# Patient Record
Sex: Female | Born: 1990 | Race: White | Hispanic: No | Marital: Married | Smoking: Never smoker
Health system: Southern US, Community
[De-identification: ages and names within clinical notes are randomized; demographics above are authoritative.]

## PROBLEM LIST (undated history)

## (undated) DIAGNOSIS — F329 Major depressive disorder, single episode, unspecified: Secondary | ICD-10-CM

---

## 2018-02-13 ENCOUNTER — Emergency Department: Payer: BLUE CROSS/BLUE SHIELD

## 2018-02-13 ENCOUNTER — Emergency Department
Admission: EM | Admit: 2018-02-13 | Discharge: 2018-02-13 | Disposition: A | Discharge status: Home / Self Care | Payer: BLUE CROSS/BLUE SHIELD | Attending: Student in an Organized Health Care Education/Training Program | Admitting: Student in an Organized Health Care Education/Training Program

## 2018-02-13 ENCOUNTER — Other Ambulatory Visit: Payer: Self-pay

## 2018-02-13 DIAGNOSIS — J181 Lobar pneumonia, unspecified organism: Secondary | ICD-10-CM | POA: Insufficient documentation

## 2018-02-13 DIAGNOSIS — J189 Pneumonia, unspecified organism: Secondary | ICD-10-CM

## 2018-02-13 DIAGNOSIS — R0602 Shortness of breath: Secondary | ICD-10-CM | POA: Diagnosis present

## 2018-02-13 HISTORY — DX: Major depressive disorder, single episode, unspecified: F32.9

## 2018-02-13 MED ORDER — LEVOFLOXACIN 750 MG PO TABS: 750.0000 mg | ORAL_TABLET | Freq: Every day | ORAL | 0 refills | Status: AC

## 2018-02-13 MED ORDER — ALBUTEROL SULFATE HFA 108 (90 BASE) MCG/ACT IN AERS: 2.0000 | INHALATION_SPRAY | RESPIRATORY_TRACT | 0 refills | Status: AC | PRN

## 2018-02-13 MED ORDER — PSEUDOEPH-BROMPHEN-DM 30-2-10 MG/5ML PO SYRP: 10.0000 mL | ORAL_SOLUTION | Freq: Four times a day (QID) | ORAL | 0 refills | Status: AC | PRN

## 2018-02-13 NOTE — ED Triage Notes (Deleted)
Pt comes via POV from home with c/o cough. Pt states she went to [facility] and was checked for Flu and later given steroids.  Pt states that she felt better but then told to start antibiotic. Pt states she started them and has had cough and congestion.  Pt states fever of 101 and took motrin. Pt states she just hasn't gotten better with all the different medications.

## 2018-02-13 NOTE — ED Triage Notes (Signed)
Pt in via POV, reports cough, sore throat, headache x two days, states she feels as if it has moved into her chest, reports shortness of breath with cough.  Vitals WDL, ambulatory to triage, NAD noted at this time.

## 2018-02-13 NOTE — ED Provider Notes (Signed)
[facility] Emergency Department Provider Note  ____________________________________________  Time seen: Approximately 8:43 PM  I have reviewed the triage vital signs and the nursing notes.   HISTORY  Chief Complaint URI    HPI Janet Hawkins is a 28 y.o. female who presents the emergency department complaining of chest tightness, cough, mild sore throat, headache.  Patient reports that majority of complaint is cough.  Patient feels short of breath with coughing but not at rest.  Patient denies any fevers or chills, significant nasal congestion, ear pain, domino pain, nausea vomiting.  Patient is taken Tylenol, cough and cold medication, Robitussin with no relief.    Past Medical History:  Diagnosis Date  . Major depressive disorder     There are no active problems to display for this patient.   History reviewed. No pertinent surgical history.  Prior to Admission medications   Medication Sig Start Date End Date Taking? Authorizing Provider  albuterol (PROVENTIL HFA;VENTOLIN HFA) 108 (90 Base) MCG/ACT inhaler Inhale 2 puffs into the lungs every 4 (four) hours as needed for wheezing or shortness of breath. 02/13/18   Cuthriell, Delorise Royals, PA-C  brompheniramine-pseudoephedrine-DM 30-2-10 MG/5ML syrup Take 10 mLs by mouth 4 (four) times daily as needed. 02/13/18   Cuthriell, Delorise Royals, PA-C  levofloxacin (LEVAQUIN) 750 MG tablet Take 1 tablet (750 mg total) by mouth daily for 7 days. 02/13/18 02/20/18  Cuthriell, Delorise Royals, PA-C    Allergies Amoxicillin  No family history on file.  Social History Social History   Tobacco Use  . Smoking status: Never Smoker  . Smokeless tobacco: Never Used  Substance Use Topics  . Alcohol use: Yes  . Drug use: Never     Review of Systems  Constitutional: No fever/chills Eyes: No visual changes. No discharge ENT: Mild sore throat Cardiovascular: no chest pain. Respiratory: Positive cough. No  SOB. Gastrointestinal: No abdominal pain.  No nausea, no vomiting.  No diarrhea.  No constipation. Musculoskeletal: Negative for musculoskeletal pain. Skin: Negative for rash, abrasions, lacerations, ecchymosis. Neurological: Negative for headaches, focal weakness or numbness. 10-point ROS otherwise negative.  ____________________________________________   PHYSICAL EXAM:  VITAL SIGNS: ED Triage Vitals  Enc Vitals Group     BP 02/13/18 1826 129/87     Pulse Rate 02/13/18 1826 100     Resp 02/13/18 1826 20     Temp 02/13/18 1826 98.6 F (37 C)     Temp Source 02/13/18 1826 Oral     SpO2 02/13/18 1826 100 %     Weight 02/13/18 1827 168 lb (76.2 kg)     Height 02/13/18 1827 5\' 8"  (1.727 m)     Head Circumference --      Peak Flow --      Pain Score 02/13/18 1835 6     Pain Loc --      Pain Edu? --      Excl. in GC? --      Constitutional: Alert and oriented. Well appearing and in no acute distress. Eyes: Conjunctivae are normal. PERRL. EOMI. Head: Atraumatic. ENT:      Ears: EACs and TMs unremarkable bilaterally.      Nose: No congestion/rhinnorhea.      Mouth/Throat: Mucous membranes are moist.  Neck: No stridor.  Neck is supple full range of motion Hematological/Lymphatic/Immunilogical: No cervical lymphadenopathy. Cardiovascular: Normal rate, regular rhythm. Normal S1 and S2.  Good peripheral circulation. Respiratory: Normal respiratory effort without tachypnea or retractions. Lungs with a few crackles in  the left lower lung field.  Otherwise, no adventitious lung sounds on auscultation.Peri Jefferson air entry to the bases with no decreased or absent breath sounds. Musculoskeletal: Full range of motion to all extremities. No gross deformities appreciated. Neurologic:  Normal speech and language. No gross focal neurologic deficits are appreciated.  Skin:  Skin is warm, dry and intact. No rash noted. Psychiatric: Mood and affect are normal. Speech and behavior are normal. Patient  exhibits appropriate insight and judgement.   ____________________________________________   LABS (all labs ordered are listed, but only abnormal results are displayed)  Labs Reviewed - No data to display ____________________________________________  EKG   ____________________________________________  RADIOLOGY I personally viewed and evaluated these images as part of my medical decision making, as well as reviewing the written report by the radiologist.  Dg Chest 2 View  Result Date: 02/13/2018 CLINICAL DATA:  28 year old female with cough, sore throat and headache for the past 2 days EXAM: CHEST - 2 VIEW COMPARISON:  None FINDINGS: Focal patchy airspace opacity in the anterior aspect of the left lower lobe concerning for bronchopneumonia. No pleural effusion or pneumothorax. Cardiac and mediastinal contours are within normal limits. No acute fracture or lytic or blastic osseous lesions. The visualized upper abdominal bowel gas pattern is unremarkable. IMPRESSION: Anterior left lower lobe bronchopneumonia. Electronically Signed   By: Malachy Moan M.D.   On: 02/13/2018 19:08    ____________________________________________    PROCEDURES  Procedure(s) performed:    Procedures    Medications - No data to display   ____________________________________________   INITIAL IMPRESSION / ASSESSMENT AND PLAN / ED COURSE  Pertinent labs & imaging results that were available during my care of the patient were reviewed by me and considered in my medical decision making (see chart for details).  Review of the [place] CSRS was performed in accordance of the [facility] prior to dispensing any controlled drugs.      Patient's diagnosis is consistent with left lower lobe pneumonia.  Patient presents emergency department complaining of coughing, mild sore throat, chest tightness with cough.  Patient had evidently been treated for bronchitis, pneumonia with azithromycin, prednisone.  These  did not improve patient's symptoms.  On chest x-ray patient does have left lower lobe pneumonia.  As such, I will place patient on Levaquin.  Patient will have albuterol and Bromfed cough syrup for additional symptom control.  Tylenol and Motrin with additional over-the-counter medications as needed.  Follow-up with primary care..Patient is given ED precautions to return to the ED for any worsening or new symptoms.     ____________________________________________  FINAL CLINICAL IMPRESSION(S) / ED DIAGNOSES  Final diagnoses:  Pneumonia of left lower lobe due to infectious organism [facility])      NEW MEDICATIONS STARTED DURING THIS VISIT:  ED Discharge Orders         Ordered    levofloxacin (LEVAQUIN) 750 MG tablet  Daily     02/13/18 2047    albuterol (PROVENTIL HFA;VENTOLIN HFA) 108 (90 Base) MCG/ACT inhaler  Every 4 hours PRN     02/13/18 2047    brompheniramine-pseudoephedrine-DM 30-2-10 MG/5ML syrup  4 times daily PRN     02/13/18 2047              This chart was dictated using voice recognition software/Dragon. Despite best efforts to proofread, errors can occur which can change the meaning. Any change was purely unintentional.    Lanette Hampshire 02/13/18 2049    Willy Eddy, MD 02/13/18  2322  

## 2018-02-13 NOTE — ED Triage Notes (Addendum)
FIRST NURSE NOTE-here for cough and pain associated with cough.  Pulled for triage.

## 2018-02-21 ENCOUNTER — Other Ambulatory Visit: Payer: Self-pay

## 2018-02-21 ENCOUNTER — Emergency Department (HOSPITAL_COMMUNITY)
Admission: EM | Admit: 2018-02-21 | Discharge: 2018-02-22 | Discharge status: Against Medical Advice | Payer: BLUE CROSS/BLUE SHIELD | Attending: Emergency Medicine | Admitting: Emergency Medicine

## 2018-02-21 ENCOUNTER — Encounter (HOSPITAL_COMMUNITY): Payer: Self-pay | Admitting: *Deleted

## 2018-02-21 DIAGNOSIS — Z79899 Other long term (current) drug therapy: Secondary | ICD-10-CM | POA: Diagnosis not present

## 2018-02-21 DIAGNOSIS — R079 Chest pain, unspecified: Secondary | ICD-10-CM

## 2018-02-21 DIAGNOSIS — R200 Anesthesia of skin: Secondary | ICD-10-CM | POA: Diagnosis not present

## 2018-02-21 DIAGNOSIS — Z5320 Procedure and treatment not carried out because of patient's decision for unspecified reasons: Secondary | ICD-10-CM | POA: Insufficient documentation

## 2018-02-21 DIAGNOSIS — M5412 Radiculopathy, cervical region: Secondary | ICD-10-CM

## 2018-02-21 DIAGNOSIS — M542 Cervicalgia: Secondary | ICD-10-CM | POA: Insufficient documentation

## 2018-02-21 DIAGNOSIS — R05 Cough: Secondary | ICD-10-CM | POA: Diagnosis not present

## 2018-02-21 DIAGNOSIS — R0789 Other chest pain: Secondary | ICD-10-CM | POA: Diagnosis not present

## 2018-02-21 LAB — CBC
HCT: 36.9 % (ref 36.0–46.0)
Hemoglobin: 11.9 g/dL — ABNORMAL LOW (ref 12.0–15.0)
MCH: 30.7 pg (ref 26.0–34.0)
MCHC: 32.2 g/dL (ref 30.0–36.0)
MCV: 95.1 fL (ref 80.0–100.0)
Platelets: 295 10*3/uL (ref 150–400)
RBC: 3.88 MIL/uL (ref 3.87–5.11)
RDW: 11.4 % — ABNORMAL LOW (ref 11.5–15.5)
WBC: 8.5 10*3/uL (ref 4.0–10.5)
nRBC: 0 % (ref 0.0–0.2)

## 2018-02-21 LAB — BASIC METABOLIC PANEL
Anion gap: 10 (ref 5–15)
BUN: 9 mg/dL (ref 6–20)
CO2: 23 mmol/L (ref 22–32)
Calcium: 9 mg/dL (ref 8.9–10.3)
Chloride: 103 mmol/L (ref 98–111)
Creatinine, Ser: 0.91 mg/dL (ref 0.44–1.00)
GFR calc Af Amer: >60 mL/min (ref >60)
GFR calc non Af Amer: >60 mL/min (ref >60)
Glucose, Bld: 101 mg/dL — ABNORMAL HIGH (ref 70–99)
Potassium: 3.7 mmol/L (ref 3.5–5.1)
Sodium: 136 mmol/L (ref 135–145)

## 2018-02-21 LAB — TROPONIN I: Troponin I: <0.03 ng/mL (ref <0.03)

## 2018-02-21 MED ORDER — SODIUM CHLORIDE 0.9% FLUSH: 3.0000 mL | Freq: Once | INTRAVENOUS | Status: DC

## 2018-02-21 NOTE — ED Triage Notes (Signed)
Pt arrives with c/o chest pain. Reports she was recently seen and treated for pneumonia. She has completed medication but still having pain in the left chest with some left arm numbness. She went to urgent care prior (arrives with report -negative chest xray results and ekg). NAD

## 2018-02-22 ENCOUNTER — Emergency Department (HOSPITAL_COMMUNITY): Payer: BLUE CROSS/BLUE SHIELD

## 2018-02-22 NOTE — ED Notes (Signed)
Delay in repeat ekg,  Pt not in room.

## 2018-02-22 NOTE — ED Provider Notes (Signed)
[facility]   CSN: [id number] Arrival date & time: 02/21/18  1956     History   Chief Complaint Chief Complaint  Patient presents with  . Chest Pain    HPI Janet Hawkins is a 28 y.o. female with a hx of depression presents to the Emergency Department complaining of gradual, persistent, progressively worsening left-sided chest pain onset approximately 10 days ago.  Patient was evaluated in [facility] emergency department at [facility] on 02/13/2018 and diagnosed with pneumonia of the left lower lobe.  She was given antibiotics.  Patient reports she was additionally given albuterol inhaler however she is not using this because she does not feel it is helping her breathing.  She reports a persistent cough and persistent left-sided chest pain despite completing her antibiotic yesterday.  Patient reports she is concerned about this.  Additionally she reports left-sided neck pain and some paresthesias of the left arm.  Patient denies headache, fevers, chills, abdominal pain, nausea, vomiting, diarrhea, weakness, dizziness, syncope.  Patient does report she has persistent wheezing and some shortness of breath with significant coughing spells.  She reports this has been ongoing throughout the week.  The history is provided by the patient and medical records. No language interpreter was used.    Past Medical History:  Diagnosis Date  . Major depressive disorder     There are no active problems to display for this patient.   History reviewed. No pertinent surgical history.   OB History   No obstetric history on file.      Home Medications    Prior to Admission medications   Medication Sig Start Date End Date Taking? Authorizing Provider  albuterol (PROVENTIL HFA;VENTOLIN HFA) 108 (90 Base) MCG/ACT inhaler Inhale 2 puffs into the lungs every 4 (four) hours as needed for wheezing or shortness of breath. 02/13/18  Yes  Cuthriell, Delorise RoyalsJonathan D, PA-C  buPROPion (WELLBUTRIN XL) 300 MG 24 hr tablet Take 300 mg by mouth daily. 12/24/17  Yes [provider]  lamoTRIgine (LAMICTAL) 150 MG tablet Take 150 mg by mouth daily. 02/06/18  Yes [provider]  levocetirizine (XYZAL) 5 MG tablet Take 5 mg by mouth every evening.   Yes [provider]  Norethindrone-Ethinyl Estradiol-Fe Biphas (LO LOESTRIN FE) 1 MG-10 MCG / 10 MCG tablet Take 1 tablet by mouth daily.   Yes [provider]  venlafaxine XR (EFFEXOR-XR) 150 MG 24 hr capsule Take 150 mg by mouth daily. 02/10/18  Yes [provider]  VYVANSE 50 MG capsule Take 50 mg by mouth daily. 02/13/18  Yes [provider]  brompheniramine-pseudoephedrine-DM 30-2-10 MG/5ML syrup Take 10 mLs by mouth 4 (four) times daily as needed. Patient not taking: Reported on 02/22/2018 02/13/18   Cuthriell, Delorise RoyalsJonathan D, PA-C    Family History No family history on file.  Social History Social History   Tobacco Use  . Smoking status: Never Smoker  . Smokeless tobacco: Never Used  Substance Use Topics  . Alcohol use: Yes  . Drug use: Never     Allergies   Amoxicillin   Review of Systems Review of Systems  Constitutional: Negative for appetite change, diaphoresis, fatigue, fever and unexpected weight change.  HENT: Negative for mouth sores.   Eyes: Negative for visual disturbance.  Respiratory: Positive for cough and chest tightness. Negative for shortness of breath and wheezing.   Cardiovascular: Positive for chest pain.  Gastrointestinal: Negative for abdominal pain, constipation, diarrhea, nausea and vomiting.  Endocrine: Negative for polydipsia, polyphagia and polyuria.  Genitourinary: Negative for dysuria, frequency, hematuria and urgency.  Musculoskeletal: Positive for arthralgias ( left arm) and neck pain. Negative for back pain and neck stiffness.  Skin: Negative for rash.  Allergic/Immunologic: Negative for  immunocompromised state.  Neurological: Negative for syncope, light-headedness and headaches.  Hematological: Does not bruise/bleed easily.  Psychiatric/Behavioral: Negative for sleep disturbance. The patient is not nervous/anxious.      Physical Exam Updated Vital Signs BP 121/79   Pulse 91   Temp 98.3 F (36.8 C) (Oral)   Resp 18   LMP 01/21/2018   SpO2 97%   Physical Exam Vitals signs and nursing note reviewed.  Constitutional:      General: She is not in acute distress.    Appearance: She is well-developed. She is not diaphoretic.     Comments: Awake, alert, nontoxic appearance  HENT:     Head: Normocephalic and atraumatic.     Mouth/Throat:     Pharynx: No oropharyngeal exudate.  Eyes:     General: No scleral icterus.    Conjunctiva/sclera: Conjunctivae normal.  Neck:     Musculoskeletal: Normal range of motion and neck supple. Muscular tenderness ( left side) present. No spinous process tenderness.     Comments: Palpation of the left paraspinal muscles and left trapezius elicit paresthesias noted of the left hand. Cardiovascular:     Rate and Rhythm: Normal rate and regular rhythm.  Pulmonary:     Effort: Pulmonary effort is normal. No respiratory distress.     Breath sounds: Normal breath sounds. No wheezing.  Abdominal:     General: Bowel sounds are normal.     Palpations: Abdomen is soft. There is no mass.     Tenderness: There is no abdominal tenderness. There is no guarding or rebound.  Musculoskeletal: Normal range of motion.  Skin:    General: Skin is warm and dry.  Neurological:     Mental Status: She is alert.     Comments: Speech is clear and goal oriented Moves extremities without ataxia      ED Treatments / Results  Labs (all labs ordered are listed, but only abnormal results are displayed) Labs Reviewed  BASIC METABOLIC PANEL - Abnormal; Notable for the following components:      Result Value   Glucose, Bld 101 (*)    All other components  within normal limits  CBC - Abnormal; Notable for the following components:   Hemoglobin 11.9 (*)    RDW 11.4 (*)    All other components within normal limits  TROPONIN I  I-STAT BETA HCG BLOOD, ED ([facility], [facility], [facility] ONLY)    ED ECG REPORT   Date: 02/22/2018  Rate: 92  Rhythm: normal sinus rhythm  QRS Axis: normal  Intervals: normal  ST/T Wave abnormalities: normal  Conduction Disutrbances:none  Narrative Interpretation: NSR, unchanged from 02/14/2018  Old EKG Reviewed: unchanged  I have personally reviewed the EKG tracing and agree with the computerized printout as noted.   Radiology Dg Chest 2 View  Result Date: 02/22/2018 CLINICAL DATA:  Chest pain.  History of pneumonia. EXAM: CHEST - 2 VIEW COMPARISON:  Chest radiograph February 13, 2018 FINDINGS: Cardiomediastinal silhouette is normal. No pleural effusions or focal consolidations. Resolution of prior consolidation. Trachea projects midline and there is no pneumothorax. Soft tissue planes and included osseous structures are non-suspicious. IMPRESSION: Negative. Electronically Signed   By: Awilda Metro M.D.   On: 02/22/2018 02:20    Procedures  Procedures (including critical care time)  Medications Ordered in ED Medications  sodium chloride flush (NS) 0.9 % injection 3 mL (has no administration in time range)     Initial Impression / Assessment and Plan / ED Course  I have reviewed the triage vital signs and the nursing notes.  Pertinent labs & imaging results that were available during my care of the patient were reviewed by me and considered in my medical decision making (see chart for details).     With persistent chest pain after diagnosis of pneumonia.  X-ray today is without evidence of persistent pneumonia.  Lung sounds are clear and equal without wheezing.  Patient is afebrile without tachycardia.  She has no leg swelling or calf tenderness.  Highly doubt pulmonary embolism.  Noted is negative.  Suspect that her  chest pain is simply sequela from her pneumonia.  Discussed conservative therapies.  Unable to locate patient's initial EKG at this time.  Will repeat.  If negative patient may be discharged home.  Also with some complaints of neck pain and left arm paresthesias.  She has palpable muscle spasm to the left paraspinal muscles and palpation of this elicits paresthesia of the left arm.  Suspect radiculopathy.  Discussed conservative therapies for this as well.  Will prescribe muscle relaxer.  3:39 AM Pt eloped prior to localization of initial ECG or repeat ECG.     Final Clinical Impressions(s) / ED Diagnoses   Final diagnoses:  Left-sided chest pain  Cervical radiculopathy    ED Discharge Orders    None       Gannon Heinzman, Boyd Kerbs 02/22/18 0543    Dione Booze, MD 02/22/18 [phone]

## 2018-02-22 NOTE — ED Notes (Signed)
Patient transported to X-ray 

## 2018-02-22 NOTE — ED Notes (Signed)
  Patient not in room.  Patient did not answer when called in waiting room.  Dahlia ClientHannah Muthersbaugh PA notified.

## 2019-08-11 IMAGING — CR DG CHEST 2V
2 series · 2 of 2 positions shown · non-contrast
Comparison: None

CLINICAL DATA: 27-year-old female with cough, sore throat and
headache for the past 2 days

EXAM:
CHEST - 2 VIEW

[chest pa]
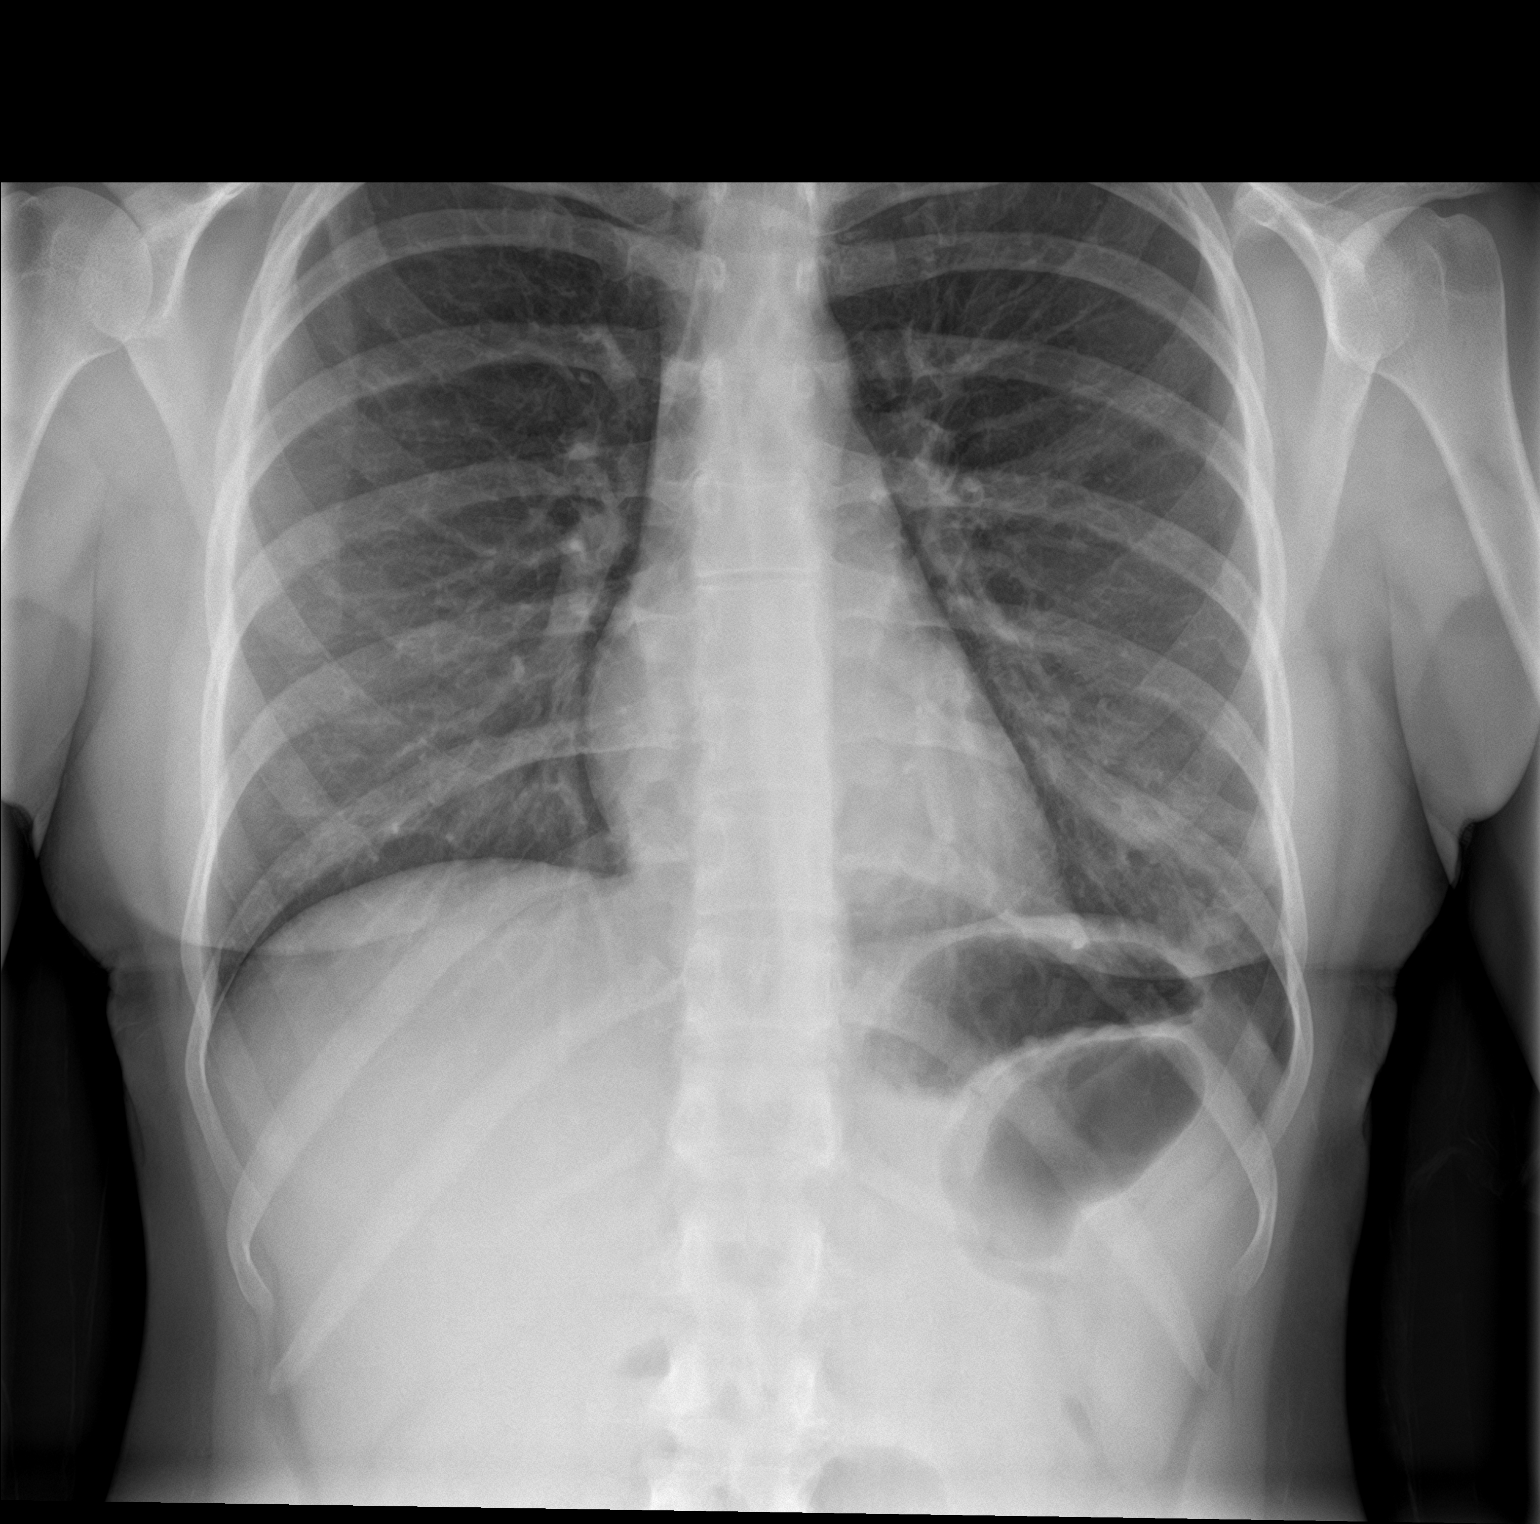

[chest lat]
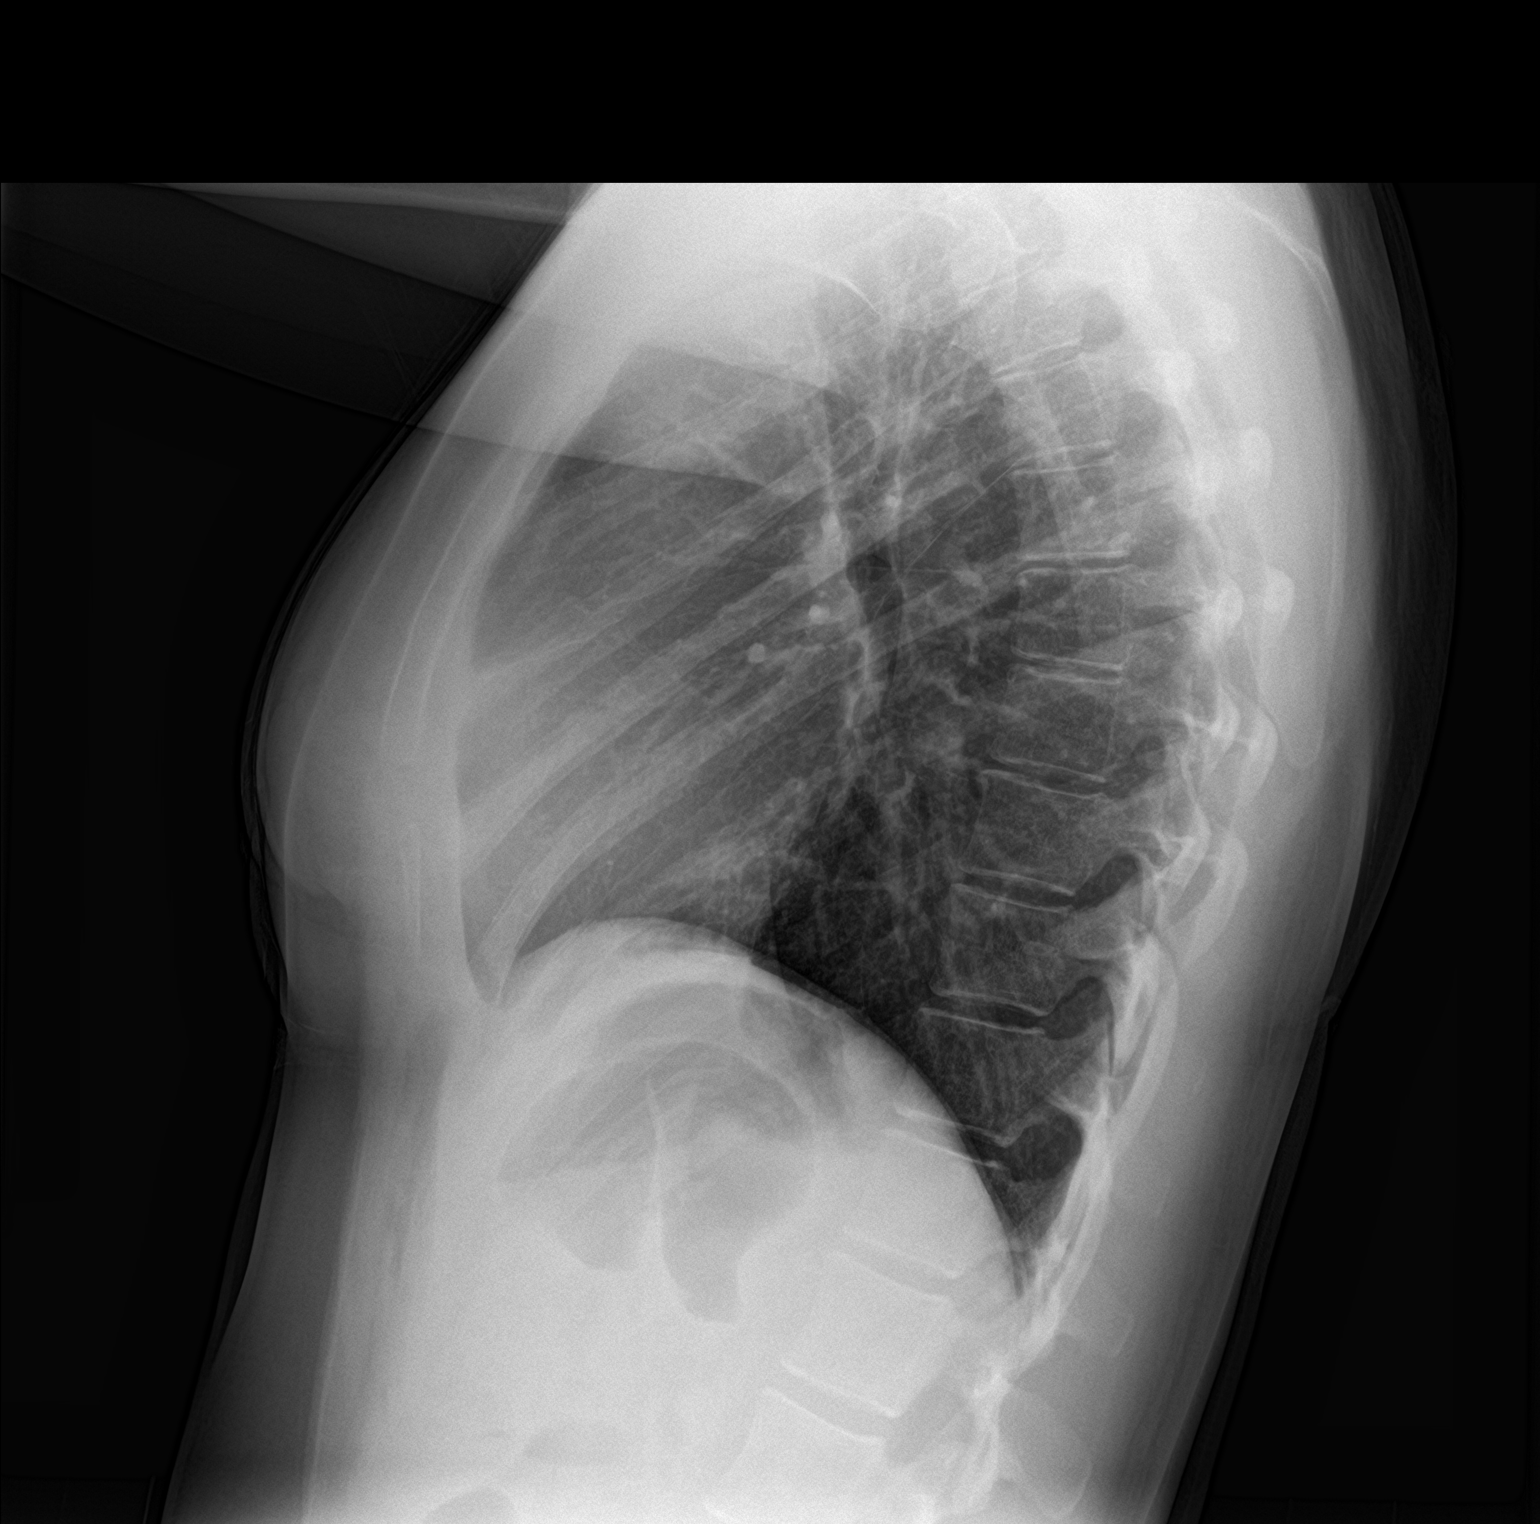

[2 of 2 positions shown; findings below may reference images not displayed]

FINDINGS: Focal patchy airspace opacity in the anterior aspect of the left
lower lobe concerning for bronchopneumonia. No pleural effusion or
pneumothorax. Cardiac and mediastinal contours are within normal
limits. No acute fracture or lytic or blastic osseous lesions. The
visualized upper abdominal bowel gas pattern is unremarkable.
IMPRESSION: Anterior left lower lobe bronchopneumonia.
